# Patient Record
Sex: Female | Born: 1960 | Race: White | Hispanic: No | Marital: Single | Smoking: Never smoker
Health system: Southern US, Community
[De-identification: ages and names within clinical notes are randomized; demographics above are authoritative.]

## PROBLEM LIST (undated history)

## (undated) DIAGNOSIS — G43909 Migraine, unspecified, not intractable, without status migrainosus: Secondary | ICD-10-CM

## (undated) DIAGNOSIS — R945 Abnormal results of liver function studies: Secondary | ICD-10-CM

## (undated) DIAGNOSIS — E785 Hyperlipidemia, unspecified: Secondary | ICD-10-CM

## (undated) DIAGNOSIS — R7989 Other specified abnormal findings of blood chemistry: Secondary | ICD-10-CM

## (undated) DIAGNOSIS — F329 Major depressive disorder, single episode, unspecified: Secondary | ICD-10-CM

## (undated) DIAGNOSIS — F419 Anxiety disorder, unspecified: Secondary | ICD-10-CM

## (undated) DIAGNOSIS — E039 Hypothyroidism, unspecified: Secondary | ICD-10-CM

## (undated) DIAGNOSIS — M8580 Other specified disorders of bone density and structure, unspecified site: Secondary | ICD-10-CM

## (undated) DIAGNOSIS — F32A Depression, unspecified: Secondary | ICD-10-CM

## (undated) DIAGNOSIS — R1013 Epigastric pain: Secondary | ICD-10-CM

## (undated) HISTORY — DX: Hypothyroidism, unspecified: E03.9

## (undated) HISTORY — DX: Abnormal results of liver function studies: R94.5

## (undated) HISTORY — DX: Epigastric pain: R10.13

## (undated) HISTORY — DX: Other specified abnormal findings of blood chemistry: R79.89

## (undated) HISTORY — DX: Major depressive disorder, single episode, unspecified: F32.9

## (undated) HISTORY — DX: Migraine, unspecified, not intractable, without status migrainosus: G43.909

## (undated) HISTORY — DX: Depression, unspecified: F32.A

## (undated) HISTORY — PX: TONSILLECTOMY: SHX5217

## (undated) HISTORY — DX: Anxiety disorder, unspecified: F41.9

## (undated) HISTORY — DX: Other specified disorders of bone density and structure, unspecified site: M85.80

## (undated) HISTORY — PX: ABDOMINAL HYSTERECTOMY: SUR658

## (undated) HISTORY — DX: Hyperlipidemia, unspecified: E78.5

---

## 2000-02-29 ENCOUNTER — Emergency Department (HOSPITAL_COMMUNITY): Admission: EM | Admit: 2000-02-29 | Discharge: 2000-02-29 | Payer: Self-pay | Admitting: Emergency Medicine

## 2000-07-21 ENCOUNTER — Encounter: Payer: Self-pay | Admitting: Family Medicine

## 2000-07-21 ENCOUNTER — Ambulatory Visit (HOSPITAL_COMMUNITY): Admission: RE | Admit: 2000-07-21 | Discharge: 2000-07-21 | Payer: Self-pay | Admitting: Family Medicine

## 2002-09-02 ENCOUNTER — Ambulatory Visit (HOSPITAL_COMMUNITY): Admission: RE | Admit: 2002-09-02 | Discharge: 2002-09-02 | Payer: Self-pay | Admitting: Family Medicine

## 2002-09-02 ENCOUNTER — Encounter: Payer: Self-pay | Admitting: Family Medicine

## 2003-01-30 ENCOUNTER — Other Ambulatory Visit: Admission: RE | Admit: 2003-01-30 | Discharge: 2003-01-30 | Payer: Self-pay | Admitting: Family Medicine

## 2003-12-24 ENCOUNTER — Encounter: Admission: RE | Admit: 2003-12-24 | Discharge: 2003-12-24 | Payer: Self-pay | Admitting: Family Medicine

## 2004-02-15 ENCOUNTER — Ambulatory Visit (HOSPITAL_COMMUNITY): Admission: RE | Admit: 2004-02-15 | Discharge: 2004-02-15 | Payer: Self-pay | Admitting: Family Medicine

## 2004-07-16 ENCOUNTER — Other Ambulatory Visit: Admission: RE | Admit: 2004-07-16 | Discharge: 2004-07-16 | Payer: Self-pay | Admitting: Family Medicine

## 2004-10-04 ENCOUNTER — Encounter (INDEPENDENT_AMBULATORY_CARE_PROVIDER_SITE_OTHER): Payer: Self-pay | Admitting: *Deleted

## 2004-10-04 ENCOUNTER — Ambulatory Visit (HOSPITAL_COMMUNITY): Admission: RE | Admit: 2004-10-04 | Discharge: 2004-10-04 | Payer: Self-pay | Admitting: Gastroenterology

## 2006-12-28 ENCOUNTER — Other Ambulatory Visit: Admission: RE | Admit: 2006-12-28 | Discharge: 2006-12-28 | Payer: Self-pay | Admitting: Family Medicine

## 2010-05-07 ENCOUNTER — Ambulatory Visit (HOSPITAL_COMMUNITY)
Admission: RE | Admit: 2010-05-07 | Discharge: 2010-05-07 | Payer: Self-pay | Source: Home / Self Care | Attending: Family Medicine | Admitting: Family Medicine

## 2010-05-19 ENCOUNTER — Ambulatory Visit: Payer: Self-pay | Admitting: Cardiovascular Disease

## 2010-05-21 ENCOUNTER — Ambulatory Visit (HOSPITAL_COMMUNITY)
Admission: RE | Admit: 2010-05-21 | Discharge: 2010-05-21 | Payer: Self-pay | Source: Home / Self Care | Attending: Family Medicine | Admitting: Family Medicine

## 2010-05-30 HISTORY — PX: CHOLECYSTECTOMY: SHX55

## 2010-06-02 ENCOUNTER — Ambulatory Visit (HOSPITAL_COMMUNITY)
Admission: RE | Admit: 2010-06-02 | Discharge: 2010-06-02 | Payer: Self-pay | Source: Home / Self Care | Attending: Surgery | Admitting: Surgery

## 2010-06-15 DIAGNOSIS — R079 Chest pain, unspecified: Secondary | ICD-10-CM | POA: Insufficient documentation

## 2010-06-19 ENCOUNTER — Encounter: Payer: Self-pay | Admitting: Family Medicine

## 2010-06-19 ENCOUNTER — Encounter: Payer: Self-pay | Admitting: Obstetrics and Gynecology

## 2010-06-20 ENCOUNTER — Encounter: Payer: Self-pay | Admitting: Family Medicine

## 2010-08-09 LAB — COMPREHENSIVE METABOLIC PANEL
AST: 23 U/L (ref 0–37)
Alkaline Phosphatase: 195 U/L — ABNORMAL HIGH (ref 39–117)
BUN: 7 mg/dL (ref 6–23)
Calcium: 10 mg/dL (ref 8.4–10.5)
Chloride: 101 mEq/L (ref 96–112)
Potassium: 4.6 mEq/L (ref 3.5–5.1)
Sodium: 139 mEq/L (ref 135–145)

## 2010-08-09 LAB — SURGICAL PCR SCREEN: Staphylococcus aureus: NEGATIVE

## 2010-08-09 LAB — CBC
Hemoglobin: 13.8 g/dL (ref 12.0–15.0)
MCHC: 33.4 g/dL (ref 30.0–36.0)
RDW: 14.6 % (ref 11.5–15.5)

## 2010-08-09 LAB — DIFFERENTIAL
Basophils Relative: 1 % (ref 0–1)
Eosinophils Relative: 2 % (ref 0–5)
Lymphocytes Relative: 33 % (ref 12–46)
Monocytes Absolute: 0.7 10*3/uL (ref 0.1–1.0)

## 2010-10-15 NOTE — Op Note (Signed)
Sabrina [name], Sabrina [name]                   ACCOUNT NO.:  [id number]   MEDICAL RECORD NO.:  [id number]          PATIENT TYPE:  AMB   LOCATION:  ENDO                         FACILITY:  [facility]   PHYSICIAN:  Petra Kuba, M.D.    DATE OF BIRTH:  24-Mar-1961   DATE OF PROCEDURE:  10/04/2004  DATE OF DISCHARGE:                                 OPERATIVE REPORT   PROCEDURE:  Colonoscopy with biopsy.   INDICATIONS FOR PROCEDURE:  Family history of colon cancer, colon polyps due  for colonic screening.  Consent was signed after risks, benefits, methods  and options were thoroughly discussed in the office with my nurse.   MEDICATIONS:  Demerol 75 and Versed 6.   DESCRIPTION OF PROCEDURE:  Rectal inspection pertinent for external  hemorrhoids, small.  Digital exam was negative.  Video pediatric adjustable  colonoscope was inserted and easily advanced around the colon to the cecum.  This did not require any position changes or abdominal pressure.  No  abnormality was seen on insertion.  The cecum was identified by the  appendiceal orifice and the ileocecal valve.  In fact, the scope was  inserted a short ways in the terminal ileum which was normal.  Scope was  slowly withdrawn.  The prep was adequate.  There was some liquid stool that  required washing and suctioning on slow withdrawal through the colon.  The  right side was normal.  The sigmoid was tortuous.  There was a rare left-  sided diverticula.  Also in the sigmoid, two questionable tiny polyps were  seen and were each cold biopsied x1 or 2 and put in the same container.  Once back in the rectum, anorectal pull through and retroflexion confirmed  some small hemorrhoids.  Scope was straightened and readvanced a short ways  up the left side of the colon.  Air was suctioned and scope removed.  Patient tolerated the procedure well.  There was no obvious immediate  complication.   ENDOSCOPIC DIAGNOSES:  1.  Internal and external small  hemorrhoids.  2.  Rare left-sided diverticula and tortuosity.  3.  Questionable two tiny sigmoid polyps cold biopsied.  4.  Otherwise within normal limits to the terminal ileum.   PLAN:  Await pathology but probably recheck colon screening in five years.  Happy to see back p.r.n., otherwise return care to Dr. Cliffton Asters for the  customary health care maintenance to include yearly rectals and guaiacs.      MEM/MEDQ  D:  10/04/2004  T:  10/04/2004  Job:  [id number]   cc:   Stacie Acres. White, M.D.  [address]  [place]  [place] [address]  Fax: [phone]

## 2011-04-15 ENCOUNTER — Encounter: Payer: Self-pay | Admitting: *Deleted

## 2011-04-15 ENCOUNTER — Encounter: Payer: Self-pay | Admitting: Cardiovascular Disease

## 2011-04-18 ENCOUNTER — Institutional Professional Consult (permissible substitution): Payer: Self-pay | Admitting: Cardiovascular Disease

## 2011-05-03 ENCOUNTER — Ambulatory Visit (INDEPENDENT_AMBULATORY_CARE_PROVIDER_SITE_OTHER): Payer: 59 | Admitting: Cardiovascular Disease

## 2011-05-03 ENCOUNTER — Encounter: Payer: Self-pay | Admitting: Cardiovascular Disease

## 2011-05-03 DIAGNOSIS — R0602 Shortness of breath: Secondary | ICD-10-CM

## 2011-05-03 DIAGNOSIS — R0600 Dyspnea, unspecified: Secondary | ICD-10-CM

## 2011-05-03 DIAGNOSIS — R079 Chest pain, unspecified: Secondary | ICD-10-CM

## 2011-05-03 LAB — TSH: TSH: 0.29 u[IU]/mL — ABNORMAL LOW (ref 0.35–5.50)

## 2011-05-03 NOTE — Assessment & Plan Note (Addendum)
Meron presents with episodes of chest pain and severe dyspnea with exertion.  Possible etiology includes CAD, pulmonary hypertension, pulmonary embolus, GERD.  She has no pleuritic component to her CP.  Her cardiac exam is normal.   We will schedule her for a stress myoview and an echo.  We will also schedule PFTs.  Obtain TSH.  I will see her in several months.

## 2011-05-03 NOTE — Progress Notes (Signed)
    Sabrina [name] Date of Birth  07/25/1960 [facility] [address]    [address] [place], [place]  [address] [phone]  Fax  [phone]  History of Present Illness:  General he is a 50 year old female who presents today for further evaluation of chest pain.  The pain is described as a chest heaviness or uncomfortable feeling. She also describes it as a squeezing sensation. It  has been constant for several weeks.  Is not worsened with taking a deep breath, twisting or turning or eating or drinking.  He it is exacerbated by lying down.  She does not get any regular exercise.  It is associated with severe shortness of breath which is exacerbated with exercise.  No diaphoresis.  She has some neck pain which she thinks may also be associated with the chest pain.  Current Outpatient Prescriptions on File Prior to Visit  Medication Sig Dispense Refill  . Calcium Carbonate-Vitamin D 600-400 MG-UNIT per tablet Take 1 tablet by mouth 2 (two) times daily.        . Cholecalciferol (VITAMIN D) 2000 UNITS tablet Take 2,000 Units by mouth daily. Taking 2 Capsules       . levothyroxine (SYNTHROID, LEVOTHROID) 137 MCG tablet Take 137 mcg by mouth daily.        . SERTRALINE HCL PO Take 100 mg by mouth daily. Taking 1/2 Tablet       . topiramate (TOPAMAX) 25 MG capsule Take 25 mg by mouth daily.        Marland Kitchen zolmitriptan (ZOMIG) 5 MG tablet Take 5 mg by mouth every 2 (two) hours as needed.          Allergies  Allergen Reactions  . Codeine   . Imitrex (Sumatriptan Base)   . Keflex   . Penicillins     Past Medical History  Diagnosis Date  . Hypothyroidism   . Anxiety   . Migraine headache   . Depression   . Vitamin D deficiency   . Abnormal LFTs (liver function tests)   . Abdominal pain, epigastric   . Osteopenia   . Hyperlipemia     Past Surgical History  Procedure Date  . Tonsillectomy   . Abdominal hysterectomy   . Cholecystectomy 2012, JAN    History  Smoking status  .  Never Smoker   Smokeless tobacco  . Not on file  She works at [employer].  History  Alcohol Use No    Family History  Problem Relation Age of Onset  . Heart failure Mother   . Heart attack Father     Reviw of Systems:  Reviewed in the HPI.  All other systems are negative.  Physical Exam: BP 132/82  Pulse 64  Ht 5' 3.5" (1.613 m)  Wt 126 lb 1.9 oz (57.208 kg)  BMI 21.99 kg/m2 The patient is [name] and oriented x 3.  The mood and affect are normal.   Skin: warm and dry.  Color is normal.    HEENT:   [place] / AT. There is no JVD. Normal carotids, neck is supple  Lungs: clear, no wheezing  Heart: RR, no murmurs, no S3 gallop  Abdomen: Soft, + BS, nontender  Extremities:  No c/c/e  Neuro:  CN II-XII intact, gait is normal. Motor and sensory function are intact  ECG: NSR, normal ECG  Assessment / Plan:

## 2011-05-03 NOTE — Patient Instructions (Signed)
Your physician recommends that you schedule a follow-up appointment in: 1 month  Your physician has requested that you have an echocardiogram. Echocardiography is a painless test that uses sound waves to create images of your heart. It provides your doctor with information about the size and shape of your heart and how well your heart's chambers and valves are working. This procedure takes approximately one hour. There are no restrictions for this procedure.   Your physician has requested that you have en exercise stress myoview. For further information please visit [email or web address] Please follow instruction sheet, as given.  Your physician has recommended that you have a pulmonary function test. Pulmonary Function Tests are a group of tests that measure how  well air moves in and out of your lungs.  Your physician recommends that you have lab work today; TSH

## 2011-06-02 ENCOUNTER — Telehealth: Payer: Self-pay | Admitting: *Deleted

## 2011-06-02 NOTE — Telephone Encounter (Signed)
Called pt to see if she wanted to reschedule her stress and echo, pt stated she would call back when she has her new work schedule.

## 2011-06-06 ENCOUNTER — Encounter (HOSPITAL_COMMUNITY): Payer: 59 | Admitting: Radiology

## 2011-06-06 ENCOUNTER — Other Ambulatory Visit (HOSPITAL_COMMUNITY): Payer: 59 | Admitting: Radiology

## 2011-06-16 ENCOUNTER — Ambulatory Visit: Payer: 59 | Admitting: Cardiovascular Disease

## 2012-01-17 ENCOUNTER — Encounter: Payer: Self-pay | Admitting: Nurse Practitioner

## 2012-01-17 ENCOUNTER — Ambulatory Visit (INDEPENDENT_AMBULATORY_CARE_PROVIDER_SITE_OTHER): Payer: 59 | Admitting: Nurse Practitioner

## 2012-01-17 VITALS — BP 121/84 | HR 65 | Ht 63.0 in | Wt 138.0 lb

## 2012-01-17 DIAGNOSIS — G43909 Migraine, unspecified, not intractable, without status migrainosus: Secondary | ICD-10-CM

## 2012-01-17 DIAGNOSIS — G43011 Migraine without aura, intractable, with status migrainosus: Secondary | ICD-10-CM

## 2012-01-17 MED ORDER — PREDNISONE 20 MG PO TABS: 20.0000 mg | ORAL_TABLET | Freq: Every day | ORAL | Status: AC

## 2012-01-17 MED ORDER — TOPIRAMATE 25 MG PO CPSP: 25.0000 mg | ORAL_CAPSULE | Freq: Three times a day (TID) | ORAL | Status: DC

## 2012-01-17 MED ORDER — PROMETHAZINE HCL 25 MG PO TABS: 25.0000 mg | ORAL_TABLET | Freq: Four times a day (QID) | ORAL | Status: DC | PRN

## 2012-01-17 NOTE — Progress Notes (Signed)
Diagnosis: Migraine without aura  History: Consult for new migraine patient today. She has had migraine since she was 51 years old. Denies aura. In the last 30 days she has had a daily migraine and she has been taking Imitrex, Relpax and Frova. These have not made migraine go away, but have reduced pain from severe to moderate. She is currently out of work due to headache pain. She has no explanation for migraines being worse. She is somewhat stressed over her son who has some emotional issues and has moved back home. Otherwise she is doing well with her life. She had an episode of chest pain 2 weeks ago and did not keep her stress test follow up appointment. She has had no further pains.   Location: Right temple  Number of Headache days/month:  Severe:0 Moderate: 30 with medication/ without would be severe Mild:0  Current Outpatient Prescriptions on File Prior to Visit  Medication Sig Dispense Refill  . Calcium Carbonate-Vitamin D 600-400 MG-UNIT per tablet Take 1 tablet by mouth 2 (two) times daily.        . Cholecalciferol (VITAMIN D) 2000 UNITS tablet Take 2,000 Units by mouth daily. Taking 2 Capsules       . levothyroxine (SYNTHROID, LEVOTHROID) 137 MCG tablet Take 137 mcg by mouth daily.        . SERTRALINE HCL PO Take 100 mg by mouth daily. Taking 1/2 Tablet       . promethazine (PHENERGAN) 25 MG tablet Take 1 tablet (25 mg total) by mouth every 6 (six) hours as needed for nausea.  30 tablet  1  . zolmitriptan (ZOMIG) 5 MG tablet Take 5 mg by mouth every 2 (two) hours as needed.        Marland Kitchen DISCONTD: topiramate (TOPAMAX) 25 MG capsule Take 25 mg by mouth daily.          Acute prevention: has taken Relpax, Imitrex, Frova, Topamax ( only took 25mg  )  Past Medical History  Diagnosis Date  . Hypothyroidism   . Anxiety   . Migraine headache   . Depression   . Vitamin d deficiency   . Abnormal LFTs (liver function tests)   . Abdominal pain, epigastric   . Osteopenia   . Hyperlipemia      Past Surgical History  Procedure Date  . Tonsillectomy   . Abdominal hysterectomy   . Cholecystectomy 2012, JAN   Family History  Problem Relation Age of Onset  . Heart failure Mother   . Heart attack Father    Social History:  reports that she has never smoked. She does not have any smokeless tobacco history on file. She reports that she does not drink alcohol or use illicit drugs. Allergies:  Allergies  Allergen Reactions  . Cephalexin   . Codeine   . Imitrex (Sumatriptan Base)   . Penicillins     Triggers: Stress  Birth control: Hysterectomy/ has been off HRT for over 10 years  ROS: Positive for daily migraine, nausea, vomiting, neck pain, palpations  Exam: well developed, well nourished Caucasian female  General: Somewhat flat affect HEENT: Some blood vessels broken around eyes, right > left Cardiac: RRR Lungs: Clear Neuro:Negative Skin:Warm and dry  Procedure: Trigger Point injection given into right temple area. See procedure note. Total solution 5cc  Impression:migraine - common Chronic daily Migraine  Plan: We should first get an MRI of her brain. Today we did Trigger point injections into right temple to try to settle this area down.  We will also give her a prednisone taper 80mg x1day, 60mg x1day, 40mg x1day and 20 mgx1day  She will start tonight. Hopefully this will bridge her over until the Topamax starts working.  She will need to start on preventative and as she did well with Topamax in past we will restart this time and taper up dose. She is asked to call cardiology and reschedule her stress test asap.   Time Spent:45 min

## 2012-01-17 NOTE — Patient Instructions (Signed)

## 2012-01-24 ENCOUNTER — Encounter: Payer: 59 | Admitting: Nurse Practitioner

## 2012-02-07 ENCOUNTER — Ambulatory Visit (INDEPENDENT_AMBULATORY_CARE_PROVIDER_SITE_OTHER): Payer: 59 | Admitting: Nurse Practitioner

## 2012-02-07 ENCOUNTER — Encounter: Payer: Self-pay | Admitting: Nurse Practitioner

## 2012-02-07 VITALS — BP 130/91 | HR 68 | Ht 63.0 in | Wt 140.0 lb

## 2012-02-07 DIAGNOSIS — G43709 Chronic migraine without aura, not intractable, without status migrainosus: Secondary | ICD-10-CM

## 2012-02-07 MED ORDER — TOPIRAMATE 100 MG PO TABS: 100.0000 mg | ORAL_TABLET | Freq: Every day | ORAL | Status: DC

## 2012-02-07 MED ORDER — ZOLMITRIPTAN 5 MG PO TABS: 5.0000 mg | ORAL_TABLET | ORAL | Status: DC | PRN

## 2012-02-07 NOTE — Patient Instructions (Addendum)

## 2012-02-07 NOTE — Progress Notes (Signed)
S: Pt is returning today for follow up on Chronic Migraine headaches. Since the last visit she has had 2-3 days without a headache. Her headaches are less severe. She has not had MRI. She is complaining of additional neurological symptoms including inability to focus and loss of memory. She has been seen by neurology in past 5-8 years and can not remember MD's name. She did not follow up with Cardiology as she " feels she is not up to an eight hour test". She did not feel the Trigger Point Injections were helpful, nor the prednisone. She is up to 75 mg on Topamax and is willing to go up to 100 mg. She does have some tingling of her fingers as side effects.   O: Alert, oriented, appears in less pain. Neuro: negative Skin: warm and dry  A: Chronic migraine  P: Needs to get MRI done. Also will refer to Neurologist to evaluate other neuro issues. Will increase Topamax to 100mg . Will give Rx for zomig 5mg . Pt will have FMLA filled out by PCP. She has been out of work for last 5 weeks.

## 2012-02-14 ENCOUNTER — Ambulatory Visit (HOSPITAL_COMMUNITY)
Admission: RE | Admit: 2012-02-14 | Discharge: 2012-02-14 | Disposition: A | Discharge status: Home / Self Care | Payer: 59 | Source: Ambulatory Visit | Attending: Nurse Practitioner | Admitting: Nurse Practitioner

## 2012-02-14 DIAGNOSIS — G43909 Migraine, unspecified, not intractable, without status migrainosus: Secondary | ICD-10-CM

## 2012-02-14 DIAGNOSIS — G43709 Chronic migraine without aura, not intractable, without status migrainosus: Secondary | ICD-10-CM | POA: Insufficient documentation

## 2012-03-13 ENCOUNTER — Encounter: Payer: 59 | Admitting: Nurse Practitioner

## 2012-03-13 DIAGNOSIS — G43019 Migraine without aura, intractable, without status migrainosus: Secondary | ICD-10-CM

## 2013-01-24 ENCOUNTER — Telehealth: Payer: Self-pay | Admitting: Cardiovascular Disease

## 2013-01-24 NOTE — Telephone Encounter (Signed)
App given/ been since 2012 since seen

## 2013-01-24 NOTE — Telephone Encounter (Signed)
New problem    Patient calling to set up an appt for stress test.

## 2013-02-01 ENCOUNTER — Ambulatory Visit (INDEPENDENT_AMBULATORY_CARE_PROVIDER_SITE_OTHER): Payer: BC Managed Care – PPO | Admitting: Cardiovascular Disease

## 2013-02-01 ENCOUNTER — Encounter: Payer: Self-pay | Admitting: Cardiovascular Disease

## 2013-02-01 VITALS — BP 126/90 | HR 70 | Ht 63.0 in | Wt 138.8 lb

## 2013-02-01 DIAGNOSIS — R0600 Dyspnea, unspecified: Secondary | ICD-10-CM | POA: Insufficient documentation

## 2013-02-01 DIAGNOSIS — R079 Chest pain, unspecified: Secondary | ICD-10-CM

## 2013-02-01 DIAGNOSIS — Z820 Family history of epilepsy and other diseases of the nervous system: Secondary | ICD-10-CM

## 2013-02-01 DIAGNOSIS — R0609 Other forms of dyspnea: Secondary | ICD-10-CM

## 2013-02-01 NOTE — Patient Instructions (Addendum)
Your physician has requested that you have an exercise tolerance test.  Please also follow instruction sheet, as given.  Your physician has requested that you have an echocardiogram. Echocardiography is a painless test that uses sound waves to create images of your heart. It provides your doctor with information about the size and shape of your heart and how well your heart's chambers and valves are working. This procedure takes approximately one hour. There are no restrictions for this procedure.  Your physician recommends that you schedule a follow-up appointment in: as needed depending on test results   Your physician recommends that you continue on your current medications as directed. Please refer to the Current Medication list given to you today.

## 2013-02-01 NOTE — Assessment & Plan Note (Signed)
Sabrina [name] presents with several day complaint of chest pain and shortness breath. These symptoms have been present for the past 2 years. We had recommended a stress test and an echocardiogram but she did not have these tests performed. She presents at the recommendation of her medical doctor for stress test prior to starting medication for migraine headaches.  At this point she is done well. Her EKG remains completely normal. We'll schedule her for a regular treadmill test to make sure that she doesn't have coronary disease before starting her on Imitrex or other similar medications. I have recommended that she get an echocardiogram for further evaluation of her long-standing shortness of breath and exercise intolerance.  Assuming that this tests are normal I will see  her on an as-needed basis.

## 2013-02-01 NOTE — Progress Notes (Signed)
Sabrina [name] Date of Birth  [birth year] [facility] [address] [place] [address]    [address] [place], [place]  [address] [phone]  Fax  [phone]  History of Present Illness:  General he is a 52 year old female who presents today for further evaluation of chest pain.  The pain is described as a chest heaviness or uncomfortable feeling. She also describes it as a squeezing sensation. It  has been constant for several weeks.  Is not worsened with taking a deep breath, twisting or turning or eating or drinking.  He it is exacerbated by lying down.  She does not get any regular exercise.  It is associated with severe shortness of breath which is exacerbated with exercise.  No diaphoresis.  She has some neck pain which she thinks may also be associated with the chest pain.  Sept. 5, 2014:  Sabrina [name] was seen in 2012 for severe shortness breath with exertion.   We have recommended a stress Myoview study, echocardiogram, and pulmonary function tests.  She never had the tests performed.    She has been lightheaded.   She has been having some migraine head aches and she was told that she would need a stress test before starting   She does not get any regular exercise - because of the worsening migraine headaches with exertion.   Current Outpatient Prescriptions on File Prior to Visit  Medication Sig Dispense Refill  . Calcium Carbonate-Vitamin D 600-400 MG-UNIT per tablet Take 1 tablet by mouth 2 (two) times daily.        . Cholecalciferol (VITAMIN D) 2000 UNITS tablet Take 2,000 Units by mouth daily. Taking 2 Capsules       . levothyroxine (SYNTHROID, LEVOTHROID) 137 MCG tablet Take 137 mcg by mouth daily.        . SERTRALINE HCL PO Take 100 mg by mouth daily. Taking 1/2 Tablet        No current facility-administered medications on file prior to visit.    Allergies  Allergen Reactions  . Cephalexin   . Codeine   . Imitrex [Sumatriptan Base]   . Penicillins     Past Medical History   Diagnosis Date  . Hypothyroidism   . Anxiety   . Migraine headache   . Depression   . Vitamin D deficiency   . Abnormal LFTs (liver function tests)   . Abdominal pain, epigastric   . Osteopenia   . Hyperlipemia     Past Surgical History  Procedure Laterality Date  . Tonsillectomy    . Abdominal hysterectomy    . Cholecystectomy  2012, JAN    History  Smoking status  . Never Smoker   Smokeless tobacco  . Not on file  She works at [employer].  History  Alcohol Use No    Family History  Problem Relation Age of Onset  . Heart failure Mother   . Heart attack Father     Reviw of Systems:  Reviewed in the HPI.  All other systems are negative.  Physical Exam: BP 126/90  Pulse 70  Ht 5\' 3"  (1.6 m)  Wt 138 lb 12.8 oz (62.959 kg)  BMI 24.59 kg/m2 The patient is [name] and oriented x 3.  The mood and affect are normal.   Skin: warm and dry.  Color is normal.    HEENT:   [place] / AT. There is no JVD. Normal carotids, neck is supple  Lungs: clear, no wheezing  Heart: RR, no murmurs, no  S3 gallop  Abdomen: Soft, + BS, nontender  Extremities:  No c/c/e  Neuro:  CN II-XII intact, gait is normal. Motor and sensory function are intact  ECG: Sept. 5, 2014:  NSR . NS T abn.   Assessment / Plan:

## 2013-02-26 ENCOUNTER — Encounter: Payer: BC Managed Care – PPO | Admitting: Nurse Practitioner

## 2013-02-26 ENCOUNTER — Other Ambulatory Visit (HOSPITAL_COMMUNITY): Payer: BC Managed Care – PPO

## 2013-03-13 ENCOUNTER — Other Ambulatory Visit (HOSPITAL_COMMUNITY): Payer: BC Managed Care – PPO | Admitting: Cardiovascular Disease

## 2013-03-13 ENCOUNTER — Ambulatory Visit (INDEPENDENT_AMBULATORY_CARE_PROVIDER_SITE_OTHER): Payer: BC Managed Care – PPO | Admitting: Physician Assistant

## 2013-03-13 ENCOUNTER — Other Ambulatory Visit (HOSPITAL_COMMUNITY): Payer: Self-pay | Admitting: Cardiovascular Disease

## 2013-03-13 ENCOUNTER — Ambulatory Visit (HOSPITAL_COMMUNITY): Payer: BC Managed Care – PPO | Attending: Cardiovascular Disease | Admitting: Cardiology

## 2013-03-13 DIAGNOSIS — R0609 Other forms of dyspnea: Secondary | ICD-10-CM

## 2013-03-13 DIAGNOSIS — R079 Chest pain, unspecified: Secondary | ICD-10-CM

## 2013-03-13 DIAGNOSIS — R0600 Dyspnea, unspecified: Secondary | ICD-10-CM

## 2013-03-13 DIAGNOSIS — R0602 Shortness of breath: Secondary | ICD-10-CM | POA: Insufficient documentation

## 2013-03-13 DIAGNOSIS — Z820 Family history of epilepsy and other diseases of the nervous system: Secondary | ICD-10-CM

## 2013-03-13 DIAGNOSIS — E785 Hyperlipidemia, unspecified: Secondary | ICD-10-CM | POA: Insufficient documentation

## 2013-03-13 DIAGNOSIS — I059 Rheumatic mitral valve disease, unspecified: Secondary | ICD-10-CM | POA: Insufficient documentation

## 2013-03-13 NOTE — Progress Notes (Signed)
Echo performed. 

## 2013-03-13 NOTE — Progress Notes (Signed)
Exercise Treadmill Test  Pre-Exercise Testing Evaluation Rhythm: normal sinus  Rate: 75 bpm     Test  Exercise Tolerance Test Ordering MD: Kristeen Miss, MD  Interpreting MD: Tereso Newcomer PA-C  Unique Test No: 1  Treadmill:  1  Indication for ETT: chest pain - rule out ischemia  Contraindication to ETT: No   Stress Modality: exercise - treadmill  Cardiac Imaging Performed: non   Protocol: standard Bruce - maximal  Max BP:  159/79  Max MPHR (bpm):  167 85% MPR (bpm):  142  MPHR obtained (bpm):  157 % MPHR obtained:  94  Reached 85% MPHR (min:sec):  5:15 Total Exercise Time (min-sec):  7:03  Workload in METS:  8.6 Borg Scale: 17  Reason ETT Terminated:  patient's desire to stop    ST Segment Analysis At Rest: non-specific ST segment slurring With Exercise: non-specific ST changes  Other Information Arrhythmia:  No Angina during ETT:  absent (0) Quality of ETT:  diagnostic  ETT Interpretation:  normal - no evidence of ischemia by ST analysis  Comments: Good exercise capacity. No chest pain. Normal BP response to exercise. No significant ST-T changes to suggest ischemia.   Recommendations: F/u with Dr. Delane Ginger as directed. Signed,  Tereso Newcomer, PA-C   03/13/2013 3:30 PM

## 2014-03-31 ENCOUNTER — Encounter: Payer: Self-pay | Admitting: Cardiovascular Disease

## 2014-12-22 ENCOUNTER — Encounter: Payer: Self-pay | Admitting: Neurology

## 2014-12-22 ENCOUNTER — Ambulatory Visit (INDEPENDENT_AMBULATORY_CARE_PROVIDER_SITE_OTHER): Payer: Self-pay | Admitting: Neurology

## 2014-12-22 VITALS — BP 124/70 | HR 66 | Resp 18 | Ht 63.0 in | Wt 134.2 lb

## 2014-12-22 DIAGNOSIS — G43719 Chronic migraine without aura, intractable, without status migrainosus: Secondary | ICD-10-CM

## 2014-12-22 MED ORDER — ATENOLOL 50 MG PO TABS: 50.0000 mg | ORAL_TABLET | Freq: Every day | ORAL | Status: AC

## 2014-12-22 NOTE — Progress Notes (Signed)
NEUROLOGY CONSULTATION NOTE  Sabrina [name] MRN: [id number] DOB: 1960/06/21  Referring provider: Dr. Nancy Fetter Primary care provider: Dr. Nancy Fetter  Reason for consult:  Chronic migraine  HISTORY OF PRESENT ILLNESS: Sabrina [name] is a 54 year old right-handed female with hyperlipidemia, depression and hypothyroidism who presents for migraines.    As per PCP note, she used to see a headache specialist in [facility]. Onset:  54 years old Location:  Unilateral (either side) and back of head Quality:  Deep squeezing Intensity:  6-8/10 (10/10 when severe) Aura:  no Prodrome:  no Associated symptoms:  Nausea, vomiting, photophobia, phonophobia, osmophobia, blurred vision.  No dysautonomia. Duration:  3 days Frequency:  27-28 days per month (6 days per month severe) Triggers/exacerbating factors:  Hunger, oversleep, heat, change in weather Relieving factors:  Flexall gel Activity:  Cannot function 6 days per months.  Has not worked since 2013.  Past abortive medication:  Excedrin Migraine, tylenol, Advil, Aleve, Zomig (po and NS) effective for 10 years, sumatriptan (po and NS), Maxalt, Relpax, Cambia Past preventative medication:  Topiramate, amitriptyline, nortriptyline, Effexor, propranolol, Depakote, verapamil, Cymbalta Other past therapy:  Botox (caused headache and lockjaw after one session)  Current abortive medication:  none Current anti-hypertensives:  none Current antidepressants:  Zoloft 150mg  Current anticonvulsants:  gabapentin for leg pain Other therapy:  none Other medication:  Synthroid, atorvastatin  Caffeine:  2 to 4 cokes a day Alcohol:  no Smoker:  no Diet:  Tries to eat healthy, keeps hydrated3 Exercise:  no Depression/stress:  Stress and anxiety related to headaches and being out of work Sleep hygiene:  Irregular sleep cycles.  Trouble falling asleep.  Sometimes will be up all night and crash the next day. Family history of headache:  Maternal aunt, niece  PAST MEDICAL  HISTORY: Past Medical History  Diagnosis Date  . Hypothyroidism   . Anxiety   . Migraine headache   . Depression   . Vitamin D deficiency   . Abnormal LFTs (liver function tests)   . Abdominal pain, epigastric   . Osteopenia   . Hyperlipemia     PAST SURGICAL HISTORY: Past Surgical History  Procedure Laterality Date  . Tonsillectomy    . Abdominal hysterectomy    . Cholecystectomy  2012, JAN    MEDICATIONS: Current Outpatient Prescriptions on File Prior to Visit  Medication Sig Dispense Refill  . levothyroxine (SYNTHROID, LEVOTHROID) 137 MCG tablet Take 137 mcg by mouth daily.      . SERTRALINE HCL PO Take 100 mg by mouth daily. Taking 1/2 Tablet     . Calcium Carbonate-Vitamin D 600-400 MG-UNIT per tablet Take 1 tablet by mouth 2 (two) times daily.      . Cholecalciferol (VITAMIN D) 2000 UNITS tablet Take 2,000 Units by mouth daily. Taking 2 Capsules      No current facility-administered medications on file prior to visit.    ALLERGIES: Allergies  Allergen Reactions  . Cephalexin   . Codeine   . Imitrex [Sumatriptan Base]   . Penicillins     FAMILY HISTORY: Family History  Problem Relation Age of Onset  . Heart failure Mother   . Heart attack Father   . Hypertension Father   . Osteoarthritis Sister   . Cancer Brother     colon  . Crohn's disease Son   . Heart failure Maternal Grandmother     SOCIAL HISTORY: History   Social History  . Marital Status: Single    Spouse Name: N/A  .  Number of Children: N/A  . Years of Education: N/A   Occupational History  . Not on file.   Social History Main Topics  . Smoking status: Never Smoker   . Smokeless tobacco: Never Used  . Alcohol Use: No  . Drug Use: No  . Sexual Activity:    Partners: Male    Birth Control/ Protection: Surgical     Comment: hysterectomy   Other Topics Concern  . Not on file   Social History Narrative    REVIEW OF SYSTEMS: Constitutional: No fevers, chills, or sweats, no  generalized fatigue, change in appetite Eyes: No visual changes, double vision, eye pain Ear, nose and throat: No hearing loss, ear pain, nasal congestion, sore throat Cardiovascular: No chest pain, palpitations Respiratory:  No shortness of breath at rest or with exertion, wheezes GastrointestinaI: No nausea, vomiting, diarrhea, abdominal pain, fecal incontinence Genitourinary:  No dysuria, urinary retention or frequency Musculoskeletal:  Neck pain Integumentary: No rash, pruritus, skin lesions Neurological: as above Psychiatric: depression, insomnia, anxiety Endocrine: No palpitations, fatigue, diaphoresis, mood swings, change in appetite, change in weight, increased thirst Hematologic/Lymphatic:  No anemia, purpura, petechiae. Allergic/Immunologic: no itchy/runny eyes, nasal congestion, recent allergic reactions, rashes  PHYSICAL EXAM: Filed Vitals:   12/22/14 1001  BP: 124/70  Pulse: 66  Resp: 18   General: No acute distress.  Patient appears well-groomed.  Head:  Normocephalic/atraumatic Eyes:  fundi unremarkable, without vessel changes, exudates, hemorrhages or papilledema. Neck: supple, no paraspinal tenderness, full range of motion Back: bilateral tenderness Heart: regular rate and rhythm Lungs: Clear to auscultation bilaterally. Vascular: No carotid bruits. Neurological Exam: Mental status: alert and oriented to person, place, and time, recent and remote memory intact, fund of knowledge intact, attention and concentration intact, speech fluent and not dysarthric, language intact. Cranial nerves: CN I: not tested CN II: pupils equal, round and reactive to light, visual fields intact, fundi unremarkable, without vessel changes, exudates, hemorrhages or papilledema. CN III, IV, VI:  full range of motion, no nystagmus, no ptosis CN V: facial sensation intact CN VII: upper and lower face symmetric CN VIII: hearing intact CN IX, X: gag intact, uvula midline CN XI:  sternocleidomastoid and trapezius muscles intact CN XII: tongue midline Bulk & Tone: normal, no fasciculations. Motor:  5/5 throughout Sensation:  Temperature and vibration intact Deep Tendon Reflexes:  2+ throughout, toes downgoing Finger to nose testing:  intact Heel to shin:  intact Gait:  Normal station and stride.  Able to turn and walk in tandem. Romberg negative.  IMPRESSION: Chronic migraine without aura, intractable  PLAN: 1.  Will try atenolol 50mg  daily.  She is to call in 4 weeks with update 2.  At this point, abortive medication would likely be ineffective 3.  Advised to stop caffeine and start exercise 4.  Consider supplements (magnesium, riboflavin, Co-Q-10) 5.  Follow up in 3 months.  Would still consider Botox since she only had one round.  Also consider biofeedback/neurofeedback or acupuncture.  Thank you for allowing me to take part in the care of this patient.  Metta Clines, DO  CC:  Donald Prose, MD

## 2014-12-22 NOTE — Patient Instructions (Addendum)
Migraine Recommendations: 1.  Start atenolol 50mg  daily.  Call in 4 weeks with update and we can adjust dose if needed. 3.  Limit use of pain relievers to no more than 2 days out of the week.  These medications include acetaminophen, ibuprofen, triptans and narcotics.  This will help reduce risk of rebound headaches. 4.  Be aware of common food triggers such as processed sweets, processed foods with nitrites (such as deli meat, hot dogs, sausages), foods with MSG, alcohol (such as wine), chocolate, certain cheeses, certain fruits (dried fruits, some citrus fruit), vinegar, diet soda. 5.  avoiid caffeine 6.  Routine exercise 7.  Proper sleep hygiene 8.  Stay adequately hydrated with water19.  Keep a headache diary. 9.  Maintain proper stress management. 10.  Do not skip meals. 11.  Consider supplements such as magnesium citrate 400mg  daily, coenzyme Q 10 100mg  three times daily or riboflavin 400mg  daily. 12.  Follow up in 3 months.

## 2015-01-21 ENCOUNTER — Ambulatory Visit (INDEPENDENT_AMBULATORY_CARE_PROVIDER_SITE_OTHER): Payer: Self-pay | Admitting: Neurology

## 2015-01-21 ENCOUNTER — Encounter: Payer: Self-pay | Admitting: Neurology

## 2015-01-21 VITALS — BP 124/70 | HR 60 | Resp 18 | Ht 63.0 in | Wt 139.0 lb

## 2015-01-21 DIAGNOSIS — G43719 Chronic migraine without aura, intractable, without status migrainosus: Secondary | ICD-10-CM

## 2015-01-21 NOTE — Progress Notes (Signed)
NEUROLOGY FOLLOW UP OFFICE NOTE  Sabrina [name] [id number]  HISTORY OF PRESENT ILLNESS: Sabrina [name] is a 54 year old right-handed female with hyperlipidemia, depression and hypothyroidism who presents for migraines.    UPDATE: Last month, she was started on atenolol 50mg  daily.  She has only been on it for 3 weeks.  Headaches have not improved.  Dizzy spells have improved somewhat.  She returns to discuss disability.  She is the primary bread winner and has not been able to work due to her headaches.  She has gotten disability in the past by her former neurologist.  Most recently, her insurance company has denied an appeal to continue disability and she would like me to fill out the necessary forms.  PAST MEDICAL HISTORY: Past Medical History  Diagnosis Date  . Hypothyroidism   . Anxiety   . Migraine headache   . Depression   . Vitamin D deficiency   . Abnormal LFTs (liver function tests)   . Abdominal pain, epigastric   . Osteopenia   . Hyperlipemia     MEDICATIONS: Current Outpatient Prescriptions on File Prior to Visit  Medication Sig Dispense Refill  . atenolol (TENORMIN) 50 MG tablet Take 1 tablet (50 mg total) by mouth daily. 30 tablet 1  . atorvastatin (LIPITOR) 20 MG tablet 1 tablet    . Calcium Carbonate-Vitamin D 600-400 MG-UNIT per tablet Take 1 tablet by mouth 2 (two) times daily.      Marland Kitchen gabapentin (NEURONTIN) 250 MG/5ML solution 3 tablets    . levothyroxine (SYNTHROID) 137 MCG tablet 1 tablet every morning on an empty stomach    . sertraline (ZOLOFT) 100 MG tablet 1.5 tablet    . Cholecalciferol (VITAMIN D) 2000 UNITS tablet Take 2,000 Units by mouth daily. Taking 2 Capsules     . levothyroxine (SYNTHROID, LEVOTHROID) 137 MCG tablet Take 137 mcg by mouth daily.      . SERTRALINE HCL PO Take 100 mg by mouth daily. Taking 1/2 Tablet      No current facility-administered medications on file prior to visit.    ALLERGIES: Allergies  Allergen Reactions  . Cephalexin     . Codeine   . Imitrex [Sumatriptan Base]   . Penicillins     FAMILY HISTORY: Family History  Problem Relation Age of Onset  . Heart failure Mother   . Heart attack Father   . Hypertension Father   . Osteoarthritis Sister   . Cancer Brother     colon  . Crohn's disease Son   . Heart failure Maternal Grandmother     SOCIAL HISTORY: Social History   Social History  . Marital Status: Single    Spouse Name: N/A  . Number of Children: N/A  . Years of Education: N/A   Occupational History  . Not on file.   Social History Main Topics  . Smoking status: Never Smoker   . Smokeless tobacco: Never Used  . Alcohol Use: No  . Drug Use: No  . Sexual Activity:    Partners: Male    Birth Control/ Protection: Surgical     Comment: hysterectomy   Other Topics Concern  . Not on file   Social History Narrative    REVIEW OF SYSTEMS: Constitutional: No fevers, chills, or sweats, no generalized fatigue, change in appetite Eyes: No visual changes, double vision, eye pain Ear, nose and throat: No hearing loss, ear pain, nasal congestion, sore throat Cardiovascular: No chest pain, palpitations Respiratory:  No shortness of  breath at rest or with exertion, wheezes GastrointestinaI: No nausea, vomiting, diarrhea, abdominal pain, fecal incontinence Genitourinary:  No dysuria, urinary retention or frequency Musculoskeletal:  No neck pain, back pain Integumentary: No rash, pruritus, skin lesions Neurological: as above Psychiatric: No depression, insomnia, anxiety Endocrine: No palpitations, fatigue, diaphoresis, mood swings, change in appetite, change in weight, increased thirst Hematologic/Lymphatic:  No anemia, purpura, petechiae. Allergic/Immunologic: no itchy/runny eyes, nasal congestion, recent allergic reactions, rashes  PHYSICAL EXAM: Filed Vitals:   01/21/15 1306  BP: 124/70  Pulse: 60  Resp: 18   General: No acute distress.  Patient appears well-groomed.   Head:   Normocephalic/atraumatic  IMPRESSION: Chronic migraine without aura,intractable  PLAN: I explained that it is not the office's policy to fill out disability forms for headache.  I am not aware of other neurologists' policies but I did direct her to two local headache specialists (she requests a headache specialist) in [place], [place] (whom she saw many years ago) and [employer].   15 minutes spent face to face with patient, 100% spent discussing office policy regarding disability forms for headache.  Metta Clines, DO  CC:  Donald Prose, MD

## 2015-03-27 ENCOUNTER — Ambulatory Visit: Payer: Self-pay | Admitting: Neurology

## 2016-09-22 DIAGNOSIS — E559 Vitamin D deficiency, unspecified: Secondary | ICD-10-CM | POA: Diagnosis not present

## 2016-09-22 DIAGNOSIS — E039 Hypothyroidism, unspecified: Secondary | ICD-10-CM | POA: Diagnosis not present

## 2016-10-03 DIAGNOSIS — D519 Vitamin B12 deficiency anemia, unspecified: Secondary | ICD-10-CM | POA: Diagnosis not present

## 2016-10-03 DIAGNOSIS — G43109 Migraine with aura, not intractable, without status migrainosus: Secondary | ICD-10-CM | POA: Diagnosis not present

## 2016-10-03 DIAGNOSIS — G43719 Chronic migraine without aura, intractable, without status migrainosus: Secondary | ICD-10-CM | POA: Diagnosis not present

## 2016-10-03 DIAGNOSIS — M542 Cervicalgia: Secondary | ICD-10-CM | POA: Diagnosis not present

## 2016-12-06 DIAGNOSIS — E039 Hypothyroidism, unspecified: Secondary | ICD-10-CM | POA: Diagnosis not present

## 2017-03-20 DIAGNOSIS — J029 Acute pharyngitis, unspecified: Secondary | ICD-10-CM | POA: Diagnosis not present

## 2017-03-20 DIAGNOSIS — E039 Hypothyroidism, unspecified: Secondary | ICD-10-CM | POA: Diagnosis not present

## 2017-09-20 DIAGNOSIS — E039 Hypothyroidism, unspecified: Secondary | ICD-10-CM | POA: Diagnosis not present

## 2017-09-20 DIAGNOSIS — E785 Hyperlipidemia, unspecified: Secondary | ICD-10-CM | POA: Diagnosis not present

## 2017-09-20 DIAGNOSIS — F411 Generalized anxiety disorder: Secondary | ICD-10-CM | POA: Diagnosis not present

## 2017-09-20 DIAGNOSIS — Z1389 Encounter for screening for other disorder: Secondary | ICD-10-CM | POA: Diagnosis not present

## 2017-12-13 DIAGNOSIS — R52 Pain, unspecified: Secondary | ICD-10-CM | POA: Diagnosis not present

## 2017-12-13 DIAGNOSIS — G43109 Migraine with aura, not intractable, without status migrainosus: Secondary | ICD-10-CM | POA: Diagnosis not present

## 2017-12-13 DIAGNOSIS — G43719 Chronic migraine without aura, intractable, without status migrainosus: Secondary | ICD-10-CM | POA: Diagnosis not present

## 2018-02-07 DIAGNOSIS — F411 Generalized anxiety disorder: Secondary | ICD-10-CM | POA: Diagnosis not present

## 2018-02-07 DIAGNOSIS — E039 Hypothyroidism, unspecified: Secondary | ICD-10-CM | POA: Diagnosis not present

## 2018-02-07 DIAGNOSIS — Z0000 Encounter for general adult medical examination without abnormal findings: Secondary | ICD-10-CM | POA: Diagnosis not present

## 2018-02-07 DIAGNOSIS — E785 Hyperlipidemia, unspecified: Secondary | ICD-10-CM | POA: Diagnosis not present

## 2018-02-07 DIAGNOSIS — Z1389 Encounter for screening for other disorder: Secondary | ICD-10-CM | POA: Diagnosis not present

## 2018-02-07 DIAGNOSIS — Z800 Family history of malignant neoplasm of digestive organs: Secondary | ICD-10-CM | POA: Diagnosis not present

## 2018-03-13 DIAGNOSIS — R52 Pain, unspecified: Secondary | ICD-10-CM | POA: Diagnosis not present

## 2018-03-13 DIAGNOSIS — G43719 Chronic migraine without aura, intractable, without status migrainosus: Secondary | ICD-10-CM | POA: Diagnosis not present

## 2018-05-31 DIAGNOSIS — Z1210 Encounter for screening for malignant neoplasm of intestinal tract, unspecified: Secondary | ICD-10-CM | POA: Diagnosis not present

## 2018-05-31 DIAGNOSIS — Z800 Family history of malignant neoplasm of digestive organs: Secondary | ICD-10-CM | POA: Diagnosis not present

## 2018-05-31 DIAGNOSIS — R748 Abnormal levels of other serum enzymes: Secondary | ICD-10-CM | POA: Diagnosis not present

## 2018-06-27 DIAGNOSIS — D123 Benign neoplasm of transverse colon: Secondary | ICD-10-CM | POA: Diagnosis not present

## 2018-06-27 DIAGNOSIS — Z1211 Encounter for screening for malignant neoplasm of colon: Secondary | ICD-10-CM | POA: Diagnosis not present

## 2018-06-27 DIAGNOSIS — Z800 Family history of malignant neoplasm of digestive organs: Secondary | ICD-10-CM | POA: Diagnosis not present

## 2018-06-27 DIAGNOSIS — K5730 Diverticulosis of large intestine without perforation or abscess without bleeding: Secondary | ICD-10-CM | POA: Diagnosis not present

## 2018-06-27 DIAGNOSIS — Z8371 Family history of colonic polyps: Secondary | ICD-10-CM | POA: Diagnosis not present

## 2018-06-28 DIAGNOSIS — R52 Pain, unspecified: Secondary | ICD-10-CM | POA: Diagnosis not present

## 2018-06-28 DIAGNOSIS — G43719 Chronic migraine without aura, intractable, without status migrainosus: Secondary | ICD-10-CM | POA: Diagnosis not present

## 2018-06-29 DIAGNOSIS — D123 Benign neoplasm of transverse colon: Secondary | ICD-10-CM | POA: Diagnosis not present

## 2018-08-08 DIAGNOSIS — E039 Hypothyroidism, unspecified: Secondary | ICD-10-CM | POA: Diagnosis not present

## 2018-08-08 DIAGNOSIS — F411 Generalized anxiety disorder: Secondary | ICD-10-CM | POA: Diagnosis not present

## 2018-08-08 DIAGNOSIS — E785 Hyperlipidemia, unspecified: Secondary | ICD-10-CM | POA: Diagnosis not present

## 2018-08-08 DIAGNOSIS — F331 Major depressive disorder, recurrent, moderate: Secondary | ICD-10-CM | POA: Diagnosis not present

## 2018-08-29 DIAGNOSIS — G43009 Migraine without aura, not intractable, without status migrainosus: Secondary | ICD-10-CM | POA: Diagnosis not present

## 2018-08-29 DIAGNOSIS — R52 Pain, unspecified: Secondary | ICD-10-CM | POA: Diagnosis not present

## 2018-08-29 DIAGNOSIS — G43719 Chronic migraine without aura, intractable, without status migrainosus: Secondary | ICD-10-CM | POA: Diagnosis not present

## 2018-12-21 DIAGNOSIS — R05 Cough: Secondary | ICD-10-CM | POA: Diagnosis not present

## 2019-01-11 DIAGNOSIS — M25531 Pain in right wrist: Secondary | ICD-10-CM | POA: Diagnosis not present

## 2019-01-22 DIAGNOSIS — S52571A Other intraarticular fracture of lower end of right radius, initial encounter for closed fracture: Secondary | ICD-10-CM | POA: Diagnosis not present

## 2019-01-22 DIAGNOSIS — S52614A Nondisplaced fracture of right ulna styloid process, initial encounter for closed fracture: Secondary | ICD-10-CM | POA: Diagnosis not present

## 2019-02-07 DIAGNOSIS — S52571D Other intraarticular fracture of lower end of right radius, subsequent encounter for closed fracture with routine healing: Secondary | ICD-10-CM | POA: Diagnosis not present

## 2019-02-12 DIAGNOSIS — F411 Generalized anxiety disorder: Secondary | ICD-10-CM | POA: Diagnosis not present

## 2019-02-12 DIAGNOSIS — E039 Hypothyroidism, unspecified: Secondary | ICD-10-CM | POA: Diagnosis not present

## 2019-02-12 DIAGNOSIS — F331 Major depressive disorder, recurrent, moderate: Secondary | ICD-10-CM | POA: Diagnosis not present

## 2019-02-12 DIAGNOSIS — M8588 Other specified disorders of bone density and structure, other site: Secondary | ICD-10-CM | POA: Diagnosis not present

## 2019-02-12 DIAGNOSIS — E559 Vitamin D deficiency, unspecified: Secondary | ICD-10-CM | POA: Diagnosis not present

## 2019-02-12 DIAGNOSIS — G43719 Chronic migraine without aura, intractable, without status migrainosus: Secondary | ICD-10-CM | POA: Diagnosis not present

## 2019-02-12 DIAGNOSIS — E785 Hyperlipidemia, unspecified: Secondary | ICD-10-CM | POA: Diagnosis not present

## 2019-02-12 DIAGNOSIS — Z0000 Encounter for general adult medical examination without abnormal findings: Secondary | ICD-10-CM | POA: Diagnosis not present

## 2019-02-14 ENCOUNTER — Other Ambulatory Visit: Payer: Self-pay | Admitting: Family Medicine

## 2019-02-14 DIAGNOSIS — M8580 Other specified disorders of bone density and structure, unspecified site: Secondary | ICD-10-CM

## 2019-02-14 DIAGNOSIS — Z1231 Encounter for screening mammogram for malignant neoplasm of breast: Secondary | ICD-10-CM

## 2019-02-26 DIAGNOSIS — E039 Hypothyroidism, unspecified: Secondary | ICD-10-CM | POA: Diagnosis not present

## 2019-02-26 DIAGNOSIS — R748 Abnormal levels of other serum enzymes: Secondary | ICD-10-CM | POA: Diagnosis not present

## 2019-02-26 DIAGNOSIS — E785 Hyperlipidemia, unspecified: Secondary | ICD-10-CM | POA: Diagnosis not present

## 2019-02-26 DIAGNOSIS — E559 Vitamin D deficiency, unspecified: Secondary | ICD-10-CM | POA: Diagnosis not present

## 2019-03-01 DIAGNOSIS — G43709 Chronic migraine without aura, not intractable, without status migrainosus: Secondary | ICD-10-CM | POA: Diagnosis not present

## 2019-03-12 ENCOUNTER — Other Ambulatory Visit: Payer: Self-pay | Admitting: Gastroenterology

## 2019-03-12 DIAGNOSIS — S52571D Other intraarticular fracture of lower end of right radius, subsequent encounter for closed fracture with routine healing: Secondary | ICD-10-CM | POA: Diagnosis not present

## 2019-03-12 DIAGNOSIS — S52614D Nondisplaced fracture of right ulna styloid process, subsequent encounter for closed fracture with routine healing: Secondary | ICD-10-CM | POA: Diagnosis not present

## 2019-03-12 DIAGNOSIS — R748 Abnormal levels of other serum enzymes: Secondary | ICD-10-CM

## 2019-03-19 ENCOUNTER — Ambulatory Visit
Admission: RE | Admit: 2019-03-19 | Discharge: 2019-03-19 | Disposition: A | Discharge status: Home / Self Care | Payer: Medicare Other | Source: Ambulatory Visit | Attending: Gastroenterology | Admitting: Gastroenterology

## 2019-03-19 DIAGNOSIS — R748 Abnormal levels of other serum enzymes: Secondary | ICD-10-CM | POA: Diagnosis not present

## 2019-03-22 DIAGNOSIS — J209 Acute bronchitis, unspecified: Secondary | ICD-10-CM | POA: Diagnosis not present

## 2019-04-18 DIAGNOSIS — M654 Radial styloid tenosynovitis [de Quervain]: Secondary | ICD-10-CM | POA: Diagnosis not present

## 2019-04-18 DIAGNOSIS — S52614D Nondisplaced fracture of right ulna styloid process, subsequent encounter for closed fracture with routine healing: Secondary | ICD-10-CM | POA: Diagnosis not present

## 2019-04-18 DIAGNOSIS — S52571D Other intraarticular fracture of lower end of right radius, subsequent encounter for closed fracture with routine healing: Secondary | ICD-10-CM | POA: Diagnosis not present

## 2019-05-06 ENCOUNTER — Other Ambulatory Visit: Payer: Self-pay

## 2019-05-06 ENCOUNTER — Ambulatory Visit: Payer: Self-pay

## 2019-08-02 ENCOUNTER — Ambulatory Visit
Admission: RE | Admit: 2019-08-02 | Discharge: 2019-08-02 | Disposition: A | Discharge status: Home / Self Care | Payer: Medicare Other | Source: Ambulatory Visit | Attending: Family Medicine | Admitting: Family Medicine

## 2019-08-02 ENCOUNTER — Other Ambulatory Visit: Payer: Self-pay

## 2019-08-02 DIAGNOSIS — Z780 Asymptomatic menopausal state: Secondary | ICD-10-CM | POA: Diagnosis not present

## 2019-08-02 DIAGNOSIS — Z1231 Encounter for screening mammogram for malignant neoplasm of breast: Secondary | ICD-10-CM

## 2019-08-02 DIAGNOSIS — M8580 Other specified disorders of bone density and structure, unspecified site: Secondary | ICD-10-CM

## 2019-08-02 DIAGNOSIS — M8589 Other specified disorders of bone density and structure, multiple sites: Secondary | ICD-10-CM | POA: Diagnosis not present

## 2019-08-05 DIAGNOSIS — G43709 Chronic migraine without aura, not intractable, without status migrainosus: Secondary | ICD-10-CM | POA: Diagnosis not present

## 2019-10-04 DIAGNOSIS — J209 Acute bronchitis, unspecified: Secondary | ICD-10-CM | POA: Diagnosis not present

## 2019-10-11 ENCOUNTER — Other Ambulatory Visit: Payer: Self-pay

## 2019-10-11 ENCOUNTER — Ambulatory Visit
Admission: RE | Admit: 2019-10-11 | Discharge: 2019-10-11 | Disposition: A | Discharge status: Home / Self Care | Payer: Medicare Other | Source: Ambulatory Visit | Attending: Family Medicine | Admitting: Family Medicine

## 2019-10-11 ENCOUNTER — Other Ambulatory Visit: Payer: Self-pay | Admitting: Family Medicine

## 2019-10-11 DIAGNOSIS — R0602 Shortness of breath: Secondary | ICD-10-CM | POA: Diagnosis not present

## 2019-10-11 DIAGNOSIS — R05 Cough: Secondary | ICD-10-CM | POA: Diagnosis not present

## 2019-10-11 DIAGNOSIS — R059 Cough, unspecified: Secondary | ICD-10-CM

## 2019-10-16 DIAGNOSIS — E039 Hypothyroidism, unspecified: Secondary | ICD-10-CM | POA: Diagnosis not present

## 2019-10-16 DIAGNOSIS — R748 Abnormal levels of other serum enzymes: Secondary | ICD-10-CM | POA: Diagnosis not present

## 2019-10-16 DIAGNOSIS — E785 Hyperlipidemia, unspecified: Secondary | ICD-10-CM | POA: Diagnosis not present

## 2019-10-18 DIAGNOSIS — R05 Cough: Secondary | ICD-10-CM | POA: Diagnosis not present

## 2019-10-18 DIAGNOSIS — E785 Hyperlipidemia, unspecified: Secondary | ICD-10-CM | POA: Diagnosis not present

## 2019-10-18 DIAGNOSIS — F411 Generalized anxiety disorder: Secondary | ICD-10-CM | POA: Diagnosis not present

## 2019-10-18 DIAGNOSIS — F331 Major depressive disorder, recurrent, moderate: Secondary | ICD-10-CM | POA: Diagnosis not present

## 2019-10-18 DIAGNOSIS — E039 Hypothyroidism, unspecified: Secondary | ICD-10-CM | POA: Diagnosis not present

## 2020-03-09 DIAGNOSIS — Z20822 Contact with and (suspected) exposure to covid-19: Secondary | ICD-10-CM | POA: Diagnosis not present

## 2020-04-08 DIAGNOSIS — F411 Generalized anxiety disorder: Secondary | ICD-10-CM | POA: Diagnosis not present

## 2020-04-08 DIAGNOSIS — E785 Hyperlipidemia, unspecified: Secondary | ICD-10-CM | POA: Diagnosis not present

## 2020-04-08 DIAGNOSIS — G43719 Chronic migraine without aura, intractable, without status migrainosus: Secondary | ICD-10-CM | POA: Diagnosis not present

## 2020-04-08 DIAGNOSIS — Z0000 Encounter for general adult medical examination without abnormal findings: Secondary | ICD-10-CM | POA: Diagnosis not present

## 2020-04-08 DIAGNOSIS — E039 Hypothyroidism, unspecified: Secondary | ICD-10-CM | POA: Diagnosis not present

## 2020-04-08 DIAGNOSIS — F331 Major depressive disorder, recurrent, moderate: Secondary | ICD-10-CM | POA: Diagnosis not present

## 2020-04-08 DIAGNOSIS — E559 Vitamin D deficiency, unspecified: Secondary | ICD-10-CM | POA: Diagnosis not present

## 2020-04-08 DIAGNOSIS — Z1389 Encounter for screening for other disorder: Secondary | ICD-10-CM | POA: Diagnosis not present

## 2020-04-08 DIAGNOSIS — M85859 Other specified disorders of bone density and structure, unspecified thigh: Secondary | ICD-10-CM | POA: Diagnosis not present

## 2020-07-02 DIAGNOSIS — G43719 Chronic migraine without aura, intractable, without status migrainosus: Secondary | ICD-10-CM | POA: Diagnosis not present

## 2020-09-16 DIAGNOSIS — E785 Hyperlipidemia, unspecified: Secondary | ICD-10-CM | POA: Diagnosis not present

## 2020-09-16 DIAGNOSIS — F411 Generalized anxiety disorder: Secondary | ICD-10-CM | POA: Diagnosis not present

## 2020-09-16 DIAGNOSIS — F331 Major depressive disorder, recurrent, moderate: Secondary | ICD-10-CM | POA: Diagnosis not present

## 2020-09-16 DIAGNOSIS — E039 Hypothyroidism, unspecified: Secondary | ICD-10-CM | POA: Diagnosis not present

## 2020-10-12 DIAGNOSIS — B5801 Toxoplasma chorioretinitis: Secondary | ICD-10-CM | POA: Diagnosis not present

## 2020-11-17 DIAGNOSIS — E039 Hypothyroidism, unspecified: Secondary | ICD-10-CM | POA: Diagnosis not present

## 2021-01-05 DIAGNOSIS — M542 Cervicalgia: Secondary | ICD-10-CM | POA: Diagnosis not present

## 2021-01-05 DIAGNOSIS — G43719 Chronic migraine without aura, intractable, without status migrainosus: Secondary | ICD-10-CM | POA: Diagnosis not present

## 2021-01-05 DIAGNOSIS — G2581 Restless legs syndrome: Secondary | ICD-10-CM | POA: Diagnosis not present

## 2021-04-14 DIAGNOSIS — E039 Hypothyroidism, unspecified: Secondary | ICD-10-CM | POA: Diagnosis not present

## 2021-04-14 DIAGNOSIS — M85859 Other specified disorders of bone density and structure, unspecified thigh: Secondary | ICD-10-CM | POA: Diagnosis not present

## 2021-04-14 DIAGNOSIS — F331 Major depressive disorder, recurrent, moderate: Secondary | ICD-10-CM | POA: Diagnosis not present

## 2021-04-14 DIAGNOSIS — Z0000 Encounter for general adult medical examination without abnormal findings: Secondary | ICD-10-CM | POA: Diagnosis not present

## 2021-04-14 DIAGNOSIS — G2581 Restless legs syndrome: Secondary | ICD-10-CM | POA: Diagnosis not present

## 2021-04-14 DIAGNOSIS — E785 Hyperlipidemia, unspecified: Secondary | ICD-10-CM | POA: Diagnosis not present

## 2021-04-14 DIAGNOSIS — F411 Generalized anxiety disorder: Secondary | ICD-10-CM | POA: Diagnosis not present

## 2021-04-14 DIAGNOSIS — G43719 Chronic migraine without aura, intractable, without status migrainosus: Secondary | ICD-10-CM | POA: Diagnosis not present

## 2021-10-12 DIAGNOSIS — E039 Hypothyroidism, unspecified: Secondary | ICD-10-CM | POA: Diagnosis not present

## 2021-10-12 DIAGNOSIS — G43719 Chronic migraine without aura, intractable, without status migrainosus: Secondary | ICD-10-CM | POA: Diagnosis not present

## 2021-10-12 DIAGNOSIS — E785 Hyperlipidemia, unspecified: Secondary | ICD-10-CM | POA: Diagnosis not present

## 2021-10-12 DIAGNOSIS — G2581 Restless legs syndrome: Secondary | ICD-10-CM | POA: Diagnosis not present

## 2021-10-12 DIAGNOSIS — F411 Generalized anxiety disorder: Secondary | ICD-10-CM | POA: Diagnosis not present

## 2021-10-12 DIAGNOSIS — F331 Major depressive disorder, recurrent, moderate: Secondary | ICD-10-CM | POA: Diagnosis not present

## 2021-11-11 IMAGING — MG DIGITAL SCREENING BILAT W/ TOMO W/ CAD
6 of 10 series · 6 of 30 positions shown · non-contrast
Comparison: Previous exam(s).

CLINICAL DATA: Screening.

EXAM:
DIGITAL SCREENING BILATERAL MAMMOGRAM WITH TOMO AND CAD

[L MLO synth-2D (1 of 2)]
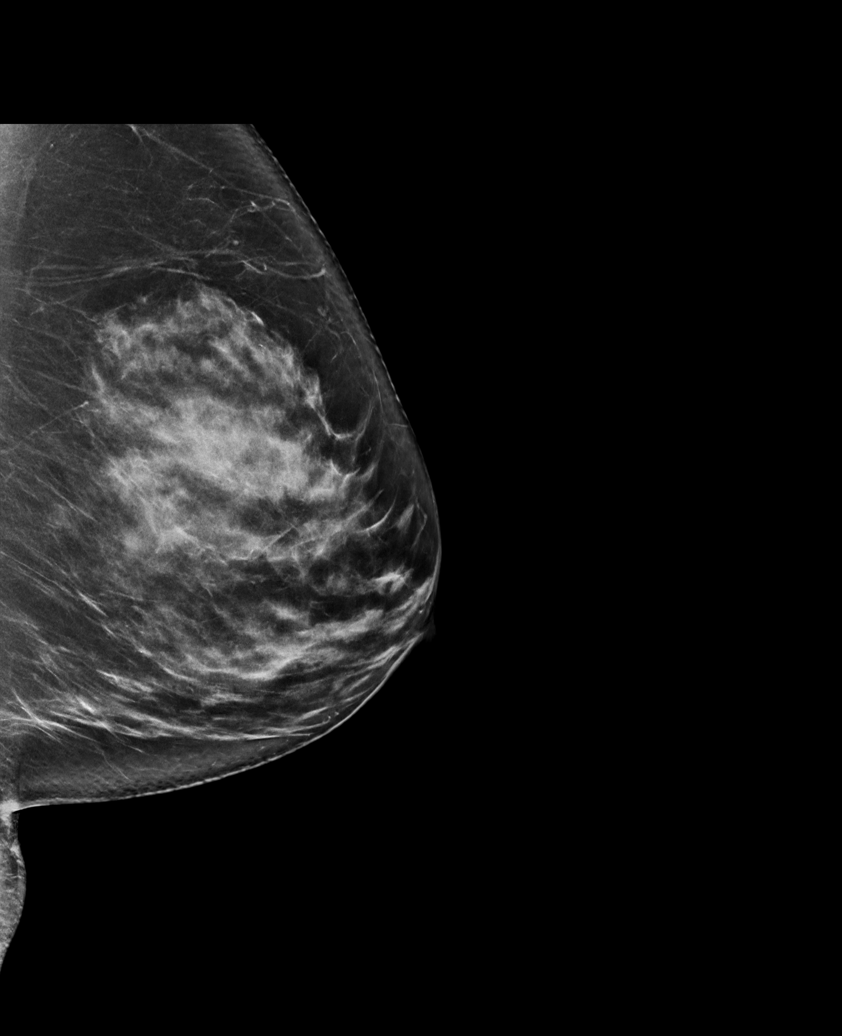

[L MLO synth-2D (2 of 2)]
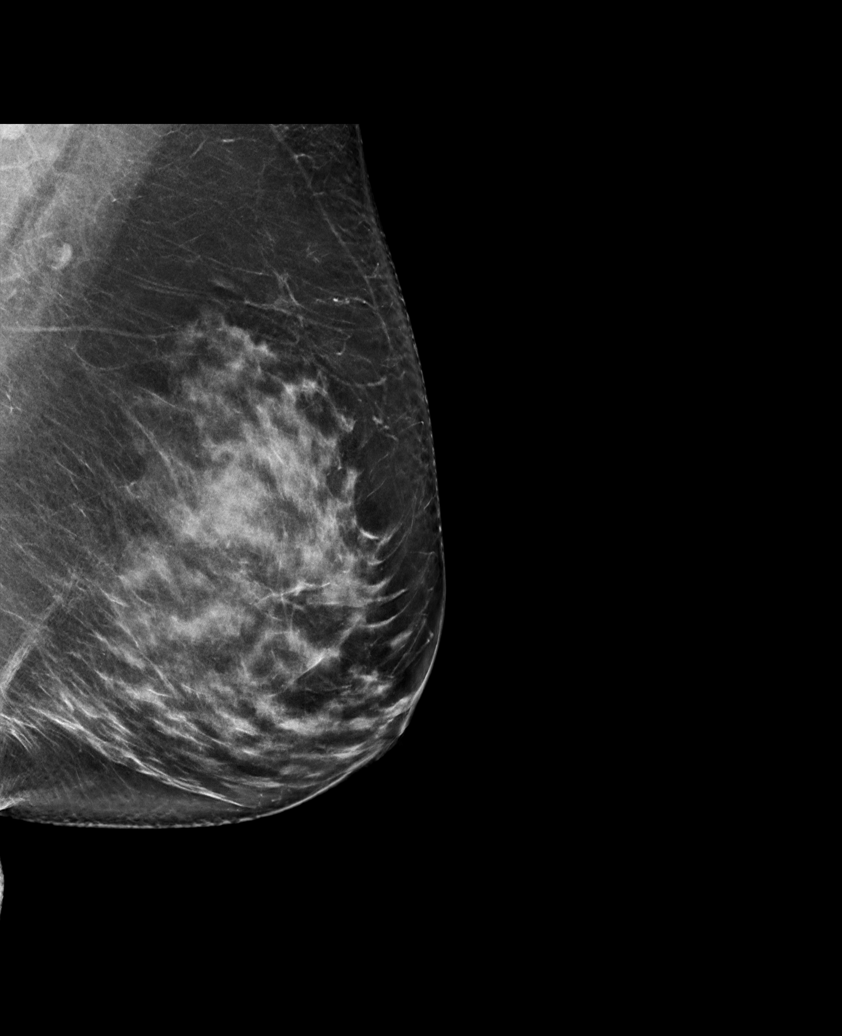

[R CC synth-2D]
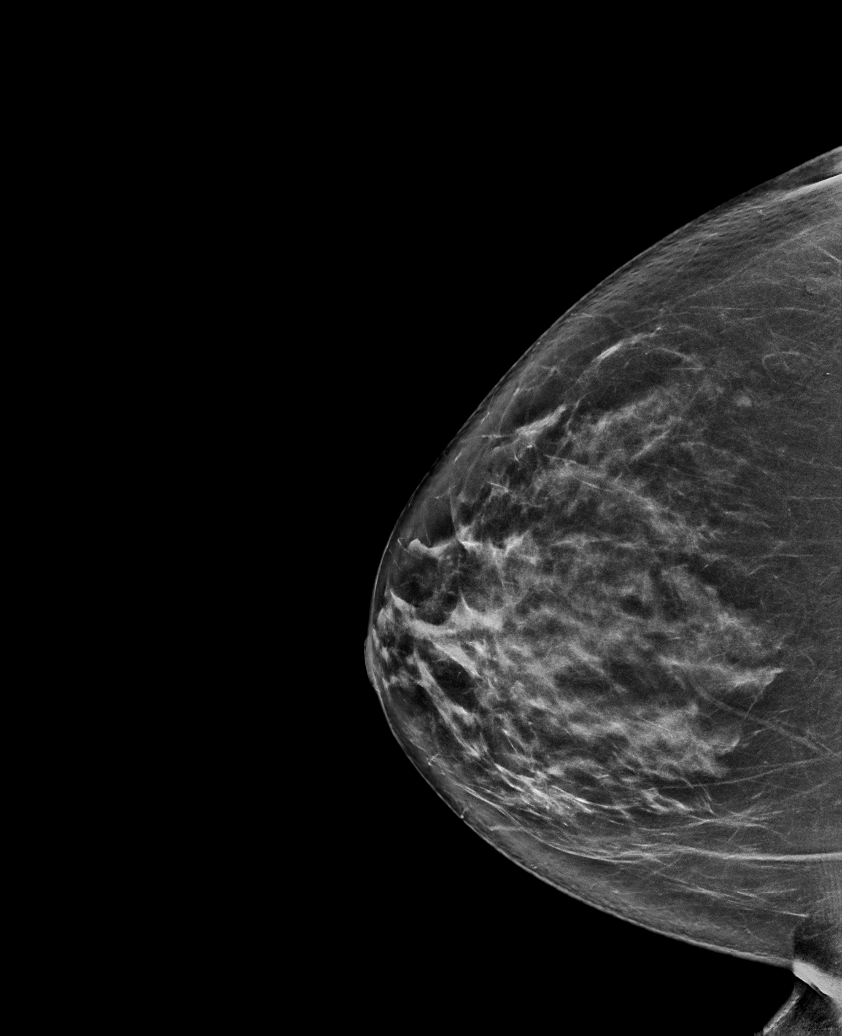

[R MLO synth-2D]
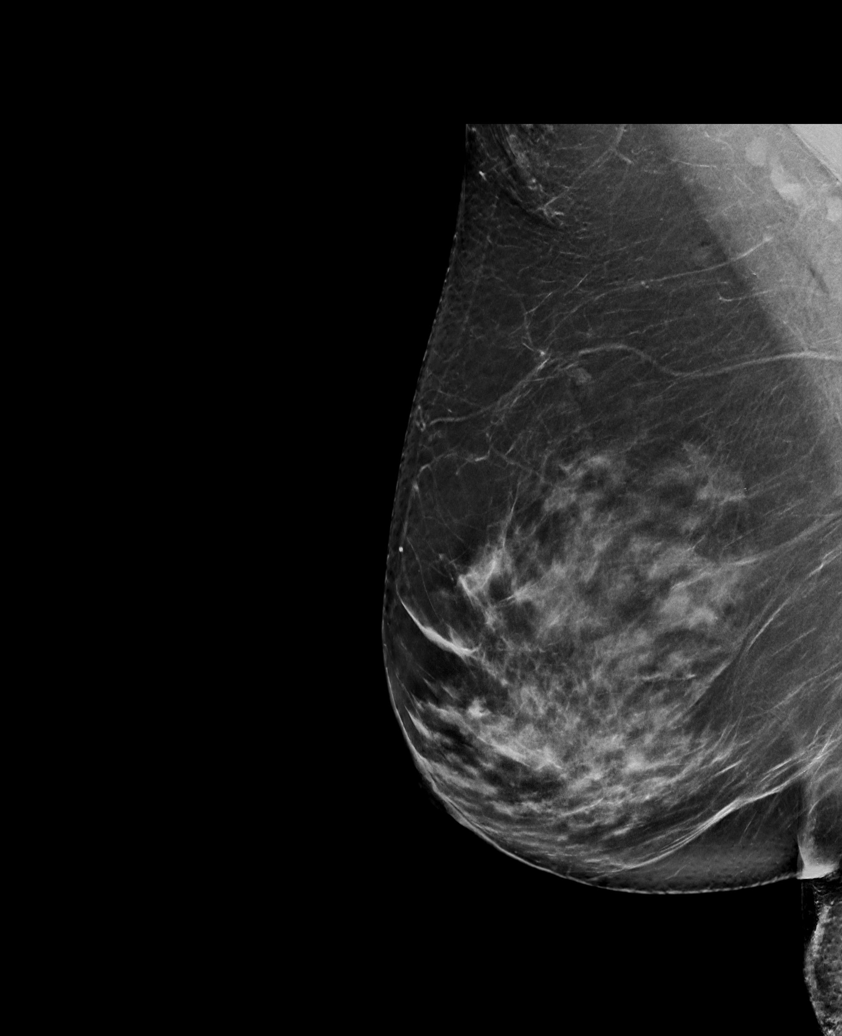

[L CC synth-2D]
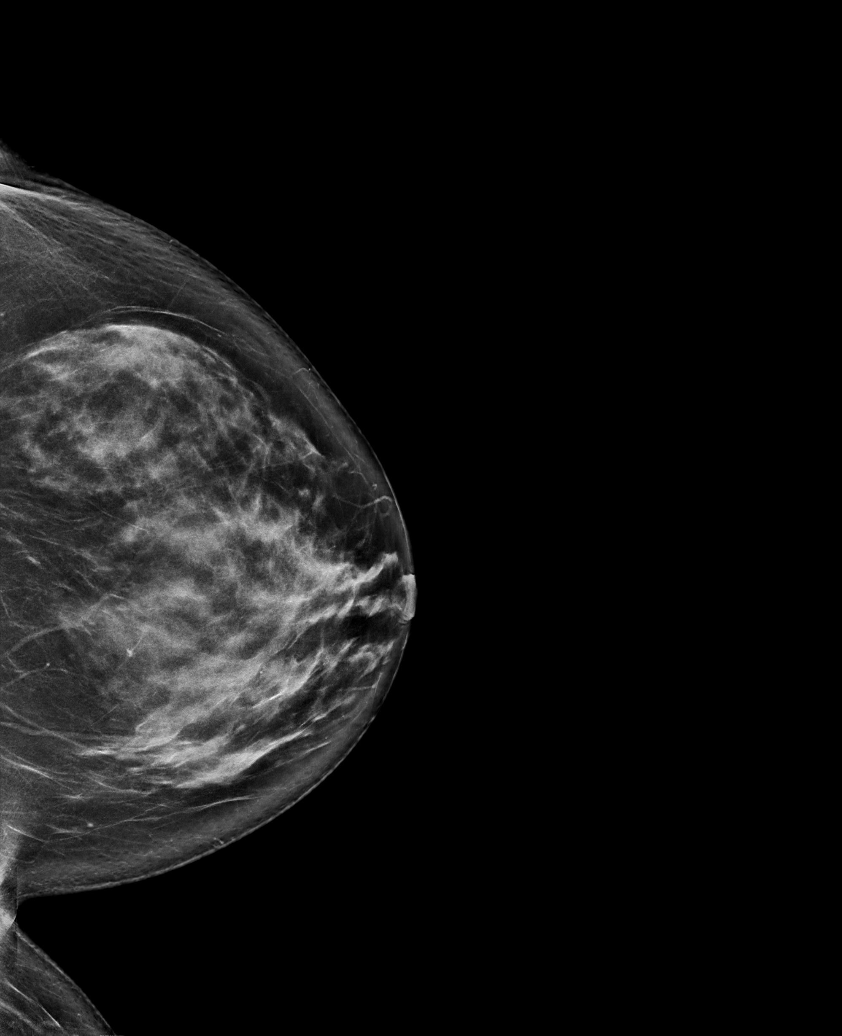

[L MLO tomo · tomo slice 41/80.0]
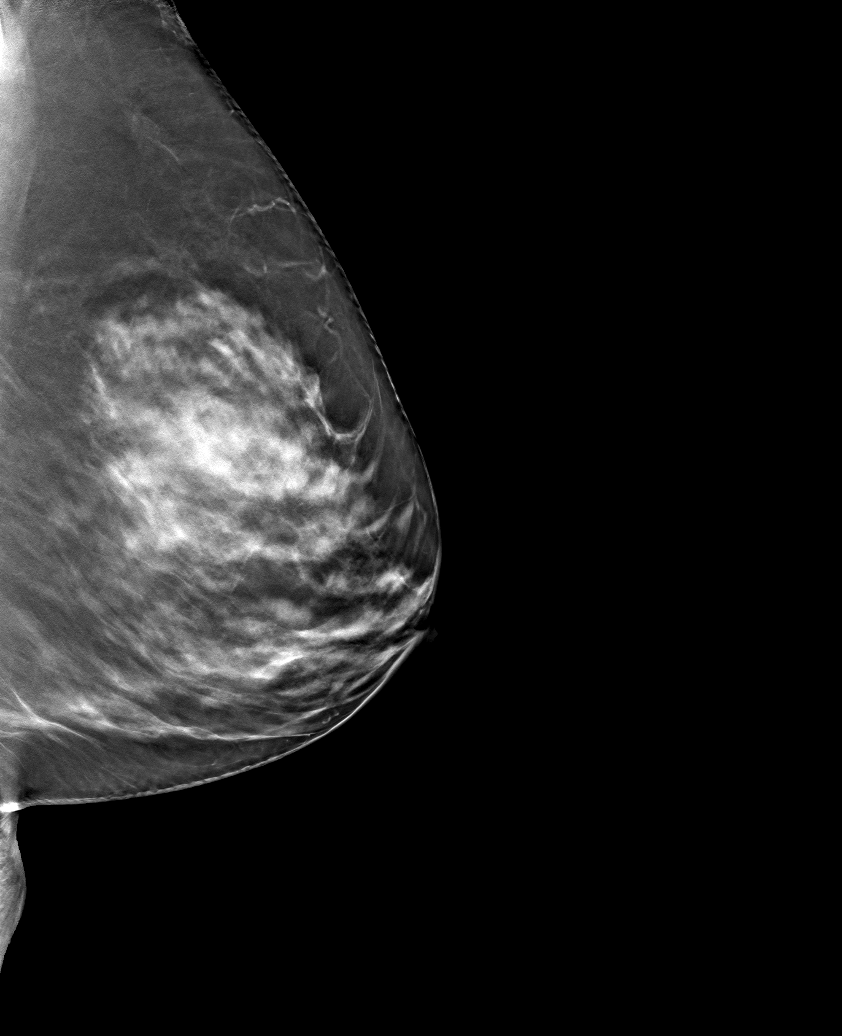

[6 of 30 positions shown; findings below may reference images not displayed]

ACR Breast Density Category c: The breast tissue is heterogeneously
dense, which may obscure small masses.
FINDINGS: There are no findings suspicious for malignancy. Images were
processed with CAD.
IMPRESSION: No mammographic evidence of malignancy. A result letter of this
screening mammogram will be mailed directly to the patient.

RECOMMENDATION:
Screening mammogram in one year. (Code:[id number]-[id number])

BI-RADS CATEGORY  1: Negative.

## 2022-03-27 DIAGNOSIS — S71111A Laceration without foreign body, right thigh, initial encounter: Secondary | ICD-10-CM | POA: Diagnosis not present

## 2022-04-06 DIAGNOSIS — S71111D Laceration without foreign body, right thigh, subsequent encounter: Secondary | ICD-10-CM | POA: Diagnosis not present

## 2022-04-12 DIAGNOSIS — Z4802 Encounter for removal of sutures: Secondary | ICD-10-CM | POA: Diagnosis not present

## 2022-04-12 DIAGNOSIS — S71111D Laceration without foreign body, right thigh, subsequent encounter: Secondary | ICD-10-CM | POA: Diagnosis not present

## 2022-08-10 ENCOUNTER — Other Ambulatory Visit: Payer: Self-pay | Admitting: Physician Assistant

## 2022-08-10 ENCOUNTER — Ambulatory Visit
Admission: RE | Admit: 2022-08-10 | Discharge: 2022-08-10 | Disposition: A | Discharge status: Home / Self Care | Payer: Medicare Other | Source: Ambulatory Visit | Attending: Physician Assistant | Admitting: Physician Assistant

## 2022-08-10 DIAGNOSIS — M25531 Pain in right wrist: Secondary | ICD-10-CM

## 2022-11-11 DIAGNOSIS — F430 Acute stress reaction: Secondary | ICD-10-CM | POA: Diagnosis not present

## 2022-11-11 DIAGNOSIS — G2581 Restless legs syndrome: Secondary | ICD-10-CM | POA: Diagnosis not present

## 2022-11-11 DIAGNOSIS — F331 Major depressive disorder, recurrent, moderate: Secondary | ICD-10-CM | POA: Diagnosis not present

## 2022-11-11 DIAGNOSIS — M85859 Other specified disorders of bone density and structure, unspecified thigh: Secondary | ICD-10-CM | POA: Diagnosis not present

## 2022-11-11 DIAGNOSIS — F411 Generalized anxiety disorder: Secondary | ICD-10-CM | POA: Diagnosis not present

## 2022-11-11 DIAGNOSIS — Z0000 Encounter for general adult medical examination without abnormal findings: Secondary | ICD-10-CM | POA: Diagnosis not present

## 2022-11-11 DIAGNOSIS — E039 Hypothyroidism, unspecified: Secondary | ICD-10-CM | POA: Diagnosis not present

## 2022-11-11 DIAGNOSIS — Z113 Encounter for screening for infections with a predominantly sexual mode of transmission: Secondary | ICD-10-CM | POA: Diagnosis not present

## 2022-11-11 DIAGNOSIS — G43719 Chronic migraine without aura, intractable, without status migrainosus: Secondary | ICD-10-CM | POA: Diagnosis not present

## 2022-11-11 DIAGNOSIS — E785 Hyperlipidemia, unspecified: Secondary | ICD-10-CM | POA: Diagnosis not present

## 2022-11-14 ENCOUNTER — Other Ambulatory Visit: Payer: Self-pay | Admitting: Family Medicine

## 2022-11-14 DIAGNOSIS — Z1231 Encounter for screening mammogram for malignant neoplasm of breast: Secondary | ICD-10-CM

## 2022-11-14 DIAGNOSIS — M8580 Other specified disorders of bone density and structure, unspecified site: Secondary | ICD-10-CM

## 2022-12-04 DIAGNOSIS — S60022A Contusion of left index finger without damage to nail, initial encounter: Secondary | ICD-10-CM | POA: Diagnosis not present

## 2023-01-16 ENCOUNTER — Inpatient Hospital Stay: Admission: RE | Admit: 2023-01-16 | Payer: Medicare Other | Source: Ambulatory Visit

## 2023-01-16 ENCOUNTER — Ambulatory Visit: Payer: Medicare Other

## 2023-02-28 DIAGNOSIS — B5801 Toxoplasma chorioretinitis: Secondary | ICD-10-CM | POA: Diagnosis not present

## 2023-03-02 DIAGNOSIS — F410 Panic disorder [episodic paroxysmal anxiety] without agoraphobia: Secondary | ICD-10-CM | POA: Diagnosis not present

## 2023-03-02 DIAGNOSIS — Z79899 Other long term (current) drug therapy: Secondary | ICD-10-CM | POA: Diagnosis not present

## 2023-03-02 DIAGNOSIS — F411 Generalized anxiety disorder: Secondary | ICD-10-CM | POA: Diagnosis not present

## 2023-03-02 DIAGNOSIS — F331 Major depressive disorder, recurrent, moderate: Secondary | ICD-10-CM | POA: Diagnosis not present

## 2023-05-17 DIAGNOSIS — F331 Major depressive disorder, recurrent, moderate: Secondary | ICD-10-CM | POA: Diagnosis not present

## 2023-05-17 DIAGNOSIS — E785 Hyperlipidemia, unspecified: Secondary | ICD-10-CM | POA: Diagnosis not present

## 2023-05-17 DIAGNOSIS — G2581 Restless legs syndrome: Secondary | ICD-10-CM | POA: Diagnosis not present

## 2023-05-17 DIAGNOSIS — E039 Hypothyroidism, unspecified: Secondary | ICD-10-CM | POA: Diagnosis not present

## 2023-05-17 DIAGNOSIS — F411 Generalized anxiety disorder: Secondary | ICD-10-CM | POA: Diagnosis not present

## 2023-05-27 DIAGNOSIS — N760 Acute vaginitis: Secondary | ICD-10-CM | POA: Diagnosis not present

## 2023-05-27 DIAGNOSIS — N7689 Other specified inflammation of vagina and vulva: Secondary | ICD-10-CM | POA: Diagnosis not present

## 2023-05-29 ENCOUNTER — Inpatient Hospital Stay: Admission: RE | Admit: 2023-05-29 | Payer: Medicare Other | Source: Ambulatory Visit

## 2023-05-29 ENCOUNTER — Ambulatory Visit: Payer: Medicare Other

## 2023-08-01 DIAGNOSIS — N898 Other specified noninflammatory disorders of vagina: Secondary | ICD-10-CM | POA: Diagnosis not present

## 2023-08-01 DIAGNOSIS — N760 Acute vaginitis: Secondary | ICD-10-CM | POA: Diagnosis not present

## 2023-09-19 DIAGNOSIS — Z5181 Encounter for therapeutic drug level monitoring: Secondary | ICD-10-CM | POA: Diagnosis not present

## 2023-09-19 DIAGNOSIS — Z79899 Other long term (current) drug therapy: Secondary | ICD-10-CM | POA: Diagnosis not present

## 2023-11-05 DIAGNOSIS — R0789 Other chest pain: Secondary | ICD-10-CM | POA: Diagnosis not present

## 2023-12-13 ENCOUNTER — Ambulatory Visit (HOSPITAL_BASED_OUTPATIENT_CLINIC_OR_DEPARTMENT_OTHER): Admitting: Cardiology

## 2023-12-15 DIAGNOSIS — F411 Generalized anxiety disorder: Secondary | ICD-10-CM | POA: Diagnosis not present

## 2023-12-15 DIAGNOSIS — Z0000 Encounter for general adult medical examination without abnormal findings: Secondary | ICD-10-CM | POA: Diagnosis not present

## 2023-12-15 DIAGNOSIS — F331 Major depressive disorder, recurrent, moderate: Secondary | ICD-10-CM | POA: Diagnosis not present

## 2023-12-15 DIAGNOSIS — R030 Elevated blood-pressure reading, without diagnosis of hypertension: Secondary | ICD-10-CM | POA: Diagnosis not present

## 2023-12-15 DIAGNOSIS — E785 Hyperlipidemia, unspecified: Secondary | ICD-10-CM | POA: Diagnosis not present

## 2023-12-15 DIAGNOSIS — G2581 Restless legs syndrome: Secondary | ICD-10-CM | POA: Diagnosis not present

## 2023-12-15 DIAGNOSIS — M85859 Other specified disorders of bone density and structure, unspecified thigh: Secondary | ICD-10-CM | POA: Diagnosis not present

## 2023-12-15 DIAGNOSIS — E039 Hypothyroidism, unspecified: Secondary | ICD-10-CM | POA: Diagnosis not present

## 2023-12-15 DIAGNOSIS — R748 Abnormal levels of other serum enzymes: Secondary | ICD-10-CM | POA: Diagnosis not present

## 2023-12-15 DIAGNOSIS — G43719 Chronic migraine without aura, intractable, without status migrainosus: Secondary | ICD-10-CM | POA: Diagnosis not present

## 2024-03-04 ENCOUNTER — Ambulatory Visit (HOSPITAL_BASED_OUTPATIENT_CLINIC_OR_DEPARTMENT_OTHER): Admitting: Cardiology

## 2024-04-18 ENCOUNTER — Ambulatory Visit (HOSPITAL_BASED_OUTPATIENT_CLINIC_OR_DEPARTMENT_OTHER): Admitting: Cardiology

## 2024-04-18 NOTE — Progress Notes (Incomplete)
  Cardiology Office Note:  .   Date:  04/18/2024  ID:  Sabrina [name], DOB [birth year], MRN [id number] PCP: Sun, Vyvyan, MD  [facility] Providers Cardiologist:  None {  History of Present Illness: .   Sabrina [name] is a 63 y.o. female with PMH who is seen as a new patient 04/18/24.  Referral from 08/22/23 reviewed. No notes available. Requested to establish care, noted family history of heart disease. She was remotely seen by Dr. Alveta in 2014 for chest pain and shortness of breath. Echo was unremarkable. Stress test, PFTs ordered but not performed.  ROS: Denies chest pain, shortness of breath at rest or with normal exertion. No PND, orthopnea, LE edema or unexpected weight gain. No syncope or palpitations. ROS otherwise negative except as noted.   Studies Reviewed: SABRA    EKG:       Physical Exam:   VS:  There were no vitals taken for this visit.   Wt Readings from Last 3 Encounters:  01/21/15 139 lb (63 kg)  12/22/14 134 lb 3.2 oz (60.9 kg)  02/01/13 138 lb 12.8 oz (63 kg)    GEN: Well nourished, well developed in no acute distress HEENT: Normal, moist mucous membranes NECK: No JVD CARDIAC: regular rhythm, normal S1 and S2, no rubs or gallops. No murmur. VASCULAR: Radial and DP pulses 2+ bilaterally. No carotid bruits RESPIRATORY:  Clear to auscultation without rales, wheezing or rhonchi  ABDOMEN: Soft, non-tender, non-distended MUSCULOSKELETAL:  Ambulates independently SKIN: Warm and dry, no edema NEUROLOGIC:  Alert and oriented x 3. No focal neuro deficits noted. PSYCHIATRIC:  Normal affect    ASSESSMENT AND PLAN: .     CV risk counseling and prevention -recommend heart healthy/Mediterranean diet, with whole grains, fruits, vegetable, fish, lean meats, nuts, and olive oil. Limit salt. -recommend moderate walking, 3-5 times/week for 30-50 minutes each session. Aim for at least 150 minutes/week. Goal should be pace of 3 miles/hours, or walking 1.5 miles in 30  minutes -recommend avoidance of tobacco products. Avoid excess alcohol. -ASCVD risk score: The ASCVD Risk score (Arnett DK, et al., 2019) failed to calculate for the following reasons:   Cannot find a previous HDL lab   Cannot find a previous total cholesterol lab    Dispo: ***  Signed, Shelda Bruckner, MD   Shelda Bruckner, MD, PhD, [facility] [place]  [facility]  [place]  [facility] [address] [place], [place] [address] [phone]

## 2024-04-27 DIAGNOSIS — S62111A Displaced fracture of triquetrum [cuneiform] bone, right wrist, initial encounter for closed fracture: Secondary | ICD-10-CM | POA: Diagnosis not present
